# Patient Record
Sex: Female | Born: 1990 | Race: White | Hispanic: No | Marital: Single | Smoking: Never smoker
Health system: Southern US, Community
[De-identification: ages and names within clinical notes are randomized; demographics above are authoritative.]

## PROBLEM LIST (undated history)

## (undated) DIAGNOSIS — Z87898 Personal history of other specified conditions: Secondary | ICD-10-CM

## (undated) DIAGNOSIS — J45909 Unspecified asthma, uncomplicated: Secondary | ICD-10-CM

---

## 2012-08-21 ENCOUNTER — Emergency Department (HOSPITAL_COMMUNITY)
Admission: EM | Admit: 2012-08-21 | Discharge: 2012-08-21 | Disposition: A | Discharge status: Home / Self Care | Payer: PRIVATE HEALTH INSURANCE | Attending: Emergency Medicine | Admitting: Emergency Medicine

## 2012-08-21 ENCOUNTER — Encounter (HOSPITAL_COMMUNITY): Payer: Self-pay | Admitting: *Deleted

## 2012-08-21 ENCOUNTER — Emergency Department (HOSPITAL_COMMUNITY): Payer: PRIVATE HEALTH INSURANCE

## 2012-08-21 DIAGNOSIS — R55 Syncope and collapse: Secondary | ICD-10-CM | POA: Insufficient documentation

## 2012-08-21 DIAGNOSIS — Z79899 Other long term (current) drug therapy: Secondary | ICD-10-CM | POA: Insufficient documentation

## 2012-08-21 DIAGNOSIS — R42 Dizziness and giddiness: Secondary | ICD-10-CM | POA: Insufficient documentation

## 2012-08-21 DIAGNOSIS — E162 Hypoglycemia, unspecified: Secondary | ICD-10-CM | POA: Insufficient documentation

## 2012-08-21 DIAGNOSIS — M25539 Pain in unspecified wrist: Secondary | ICD-10-CM | POA: Insufficient documentation

## 2012-08-21 DIAGNOSIS — Z3202 Encounter for pregnancy test, result negative: Secondary | ICD-10-CM | POA: Insufficient documentation

## 2012-08-21 HISTORY — DX: Personal history of other specified conditions: Z87.898

## 2012-08-21 HISTORY — DX: Unspecified asthma, uncomplicated: J45.909

## 2012-08-21 LAB — URINE MICROSCOPIC-ADD ON

## 2012-08-21 LAB — URINALYSIS, ROUTINE W REFLEX MICROSCOPIC
Bilirubin Urine: NEGATIVE
Nitrite: NEGATIVE
Specific Gravity, Urine: 1.009 (ref 1.005–1.030)
Urobilinogen, UA: 0.2 mg/dL (ref 0.0–1.0)
pH: 8 (ref 5.0–8.0)

## 2012-08-21 LAB — BASIC METABOLIC PANEL
CO2: 26 mEq/L (ref 19–32)
Chloride: 99 mEq/L (ref 96–112)
Glucose, Bld: 91 mg/dL (ref 70–99)
Potassium: 4 mEq/L (ref 3.5–5.1)
Sodium: 135 mEq/L (ref 135–145)

## 2012-08-21 LAB — CBC
Hemoglobin: 14.4 g/dL (ref 12.0–15.0)
Platelets: 310 10*3/uL (ref 150–400)
RBC: 4.32 MIL/uL (ref 3.87–5.11)
WBC: 5.8 10*3/uL (ref 4.0–10.5)

## 2012-08-21 LAB — GLUCOSE, CAPILLARY: Glucose-Capillary: 70 mg/dL (ref 70–99)

## 2012-08-21 LAB — POCT PREGNANCY, URINE: Preg Test, Ur: NEGATIVE

## 2012-08-21 MED ORDER — SODIUM CHLORIDE 0.9 % IV BOLUS (SEPSIS)
1000.0000 mL | Freq: Once | INTRAVENOUS | Status: AC
  Administered 2012-08-21: 1000 mL via INTRAVENOUS

## 2012-08-21 NOTE — ED Notes (Addendum)
Pt in from [employer] [facility] [facility] student center, reports having a syncopal episode while standing talking to her friend. Reports has had similar episodes in the past. Pt has small lac to bottom of chin, bleeding controlled. Pt also c/o left hand/wrist pain from fall.

## 2012-08-21 NOTE — ED Notes (Signed)
Albert, [place] notified of current blood sugar.

## 2012-08-21 NOTE — ED Provider Notes (Signed)
History     CSN: [id number]  Arrival date & time 08/21/12  1156   First MD Initiated Contact with Patient 08/21/12 1201      Chief Complaint  Patient presents with  . Loss of Consciousness    (Consider location/radiation/quality/duration/timing/severity/associated sxs/prior treatment) HPI Comments: 22 year old female with no significant past medical history presents the emergency department with her roommate from [employer] [facility] student center after having a syncopal episode about an hour prior to arrival. Patient states she was standing talking to her friend when she began feeling lightheaded with decreased hearing and then "passed out". The rest of the history is obtained from her friend since patient does not recall what happened thereafter. Friend states patient was talking to her when she noticed that her is beginning to grow back, patient started to walk away so she followed her and patient fell the ground and began having "strange body movements for a few seconds". She did not hit her head. Patient was only on the ground for a few seconds when she then opened her eyes and stood up and felt lightheaded. Lightheadedness has since subsided. Currently patient denies any symptoms. States this has happened to her a few times in the past. She went to the health Center after school and was told her blood pressure was very low and to go to the emergency department. States she has a history of low blood pressure and this is normal for her. Denies associated nausea, vomiting, confusion, fever or chills. She ate oatmeal for breakfast and was feeling fine prior to symptom onset. Last night denies doing anything besides sitting in her dorm room and relaxing. Denies recent alcohol use. Denies any drug use. Last menstrual period ended yesterday and was normal. Also complaining of L wrist pain from falling onto her L wrist during her syncopal episode.  Patient is a 22 y.o. female presenting with syncope. The history is  provided by the patient and a friend.  Loss of Consciousness  Associated symptoms include light-headedness. Pertinent negatives include confusion, dizziness, nausea and vomiting.    Past Medical History  Diagnosis Date  . Allergy-induced asthma   . H/O syncope     History reviewed. No pertinent past surgical history.  History reviewed. No pertinent family history.  History  Substance Use Topics  . Smoking status: Never Smoker   . Smokeless tobacco: Not on file  . Alcohol Use: Yes     Comment: occ    OB History   Grav Para Term Preterm Abortions TAB SAB Ect Mult Living                  Review of Systems  HENT: Positive for hearing loss and tinnitus.   Cardiovascular: Positive for syncope.  Gastrointestinal: Negative for nausea and vomiting.  Musculoskeletal:       Positive for L wrist pain.  Neurological: Positive for syncope and light-headedness. Negative for dizziness.  Psychiatric/Behavioral: Negative for confusion.  All other systems reviewed and are negative.    Allergies  Shellfish allergy  Home Medications   Current Outpatient Rx  Name  Route  Sig  Dispense  Refill  . ALPRAZolam (XANAX) 0.25 MG tablet   Oral   Take 0.25 mg by mouth 3 (three) times daily as needed for sleep or anxiety.         Marland Kitchen atomoxetine (STRATTERA) 40 MG capsule   Oral   Take 40 mg by mouth every morning.         Marland Kitchen  desvenlafaxine (PRISTIQ) 50 MG 24 hr tablet   Oral   Take 50 mg by mouth every other day.         . fluticasone (FLONASE) 50 MCG/ACT nasal spray   Nasal   Place 2 sprays into the nose daily.         Marland Kitchen loratadine (CLARITIN) 10 MG tablet   Oral   Take 10 mg by mouth daily.           BP 106/85  Pulse 71  Temp(Src) 97.8 F (36.6 C) (Oral)  Resp 16  Ht 5' (1.524 m)  Wt 110 lb (49.896 kg)  BMI 21.48 kg/m2  SpO2 100%  LMP 08/17/2012  Physical Exam  Nursing note and vitals reviewed. Constitutional: She is oriented to person, place, and time. She  appears well-developed and well-nourished. No distress.  HENT:  Head: Normocephalic and atraumatic.  Mouth/Throat: Oropharynx is clear and moist.  Eyes: Conjunctivae and EOM are normal. Pupils are equal, round, and reactive to light.  Neck: Normal range of motion. Neck supple.  Cardiovascular: Normal rate, regular rhythm, normal heart sounds and intact distal pulses.   Pulmonary/Chest: Effort normal and breath sounds normal. No respiratory distress.  Abdominal: Soft. Bowel sounds are normal. There is no tenderness.  Musculoskeletal: Normal range of motion. She exhibits no edema.       Left wrist: She exhibits tenderness and bony tenderness (generalized over carpal bones). She exhibits normal range of motion, no swelling and no deformity.       Left forearm: Normal.       Left hand: Normal. She exhibits normal capillary refill.  Neurological: She is alert and oriented to person, place, and time. She has normal strength. No cranial nerve deficit or sensory deficit. She displays a negative Romberg sign. She displays no seizure activity. Coordination and gait normal.  Skin: Skin is warm and dry. No pallor.  Psychiatric: She has a normal mood and affect. Her behavior is normal.    ED Course  Procedures (including critical care time)  Labs Reviewed  BASIC METABOLIC PANEL - Abnormal; Notable for the following:    BUN <3 (*)    All other components within normal limits  URINALYSIS, ROUTINE W REFLEX MICROSCOPIC - Abnormal; Notable for the following:    Hgb urine dipstick MODERATE (*)    Leukocytes, UA TRACE (*)    All other components within normal limits  URINE MICROSCOPIC-ADD ON - Abnormal; Notable for the following:    Squamous Epithelial / LPF FEW (*)    All other components within normal limits  CBC  GLUCOSE, CAPILLARY  POCT PREGNANCY, URINE   No results found.  Date: 08/21/2012  Rate: 64  Rhythm: normal sinus rhythm  QRS Axis: normal  Intervals: PR shortened borderline  ST/T  Wave abnormalities: normal  Conduction Disutrbances:none  Narrative Interpretation: borderline short PR interval, otherwise normal EKG  Old EKG Reviewed: none available    1. Syncope   2. Hypoglycemia       MDM  22 y/o female with syncopal episode. Asymptomatic in ED. CBG 70 upon arrival. After eating some of [place] sandwich CBG 79. Glucose on BMP 91. She is not orthostatic. Symptoms most likely from hypoglycemia. EKG normal. She may have had a possible seizure. She has no PCP. Resource guide given for PCP follow up. Close return precautions discussed. Patient states understanding of plan and is agreeable.         Trevor Mace, PA-C 08/21/12 1531

## 2012-08-21 NOTE — ED Notes (Signed)
Patient is resting comfortably. Pt talking with visitor at bedside

## 2012-08-21 NOTE — ED Notes (Signed)
Albert, PA at bedside.  

## 2012-08-22 LAB — GLUCOSE, CAPILLARY: Glucose-Capillary: 79 mg/dL (ref 70–99)

## 2012-08-25 NOTE — ED Provider Notes (Signed)
Medical screening examination/treatment/procedure(s) were performed by non-physician practitioner and as supervising physician I was immediately available for consultation/collaboration.   Suzi Roots, MD 08/25/12 2130

## 2012-12-17 LAB — HM PAP SMEAR: HM PAP: NORMAL

## 2013-05-24 ENCOUNTER — Telehealth: Payer: Self-pay

## 2013-05-24 NOTE — Telephone Encounter (Signed)
Medication and allergies:  Reviewed and updated  90 day supply/mail order: na [facility] [address]   Immunizations due: unsure of last Tdap vaccine   A/P:   FH, PSH, personal hx--states no additions or health risk in her family Pap--gyn  To Discuss with Provider: Not at this time

## 2013-05-25 ENCOUNTER — Encounter: Payer: Self-pay | Admitting: Family Medicine

## 2013-05-25 ENCOUNTER — Ambulatory Visit (INDEPENDENT_AMBULATORY_CARE_PROVIDER_SITE_OTHER): Payer: BC Managed Care – HMO | Admitting: Family Medicine

## 2013-05-25 VITALS — BP 110/80 | HR 100 | Temp 98.3°F | Resp 16 | Ht 60.5 in | Wt 113.5 lb

## 2013-05-25 DIAGNOSIS — Z309 Encounter for contraceptive management, unspecified: Secondary | ICD-10-CM

## 2013-05-25 DIAGNOSIS — F341 Dysthymic disorder: Secondary | ICD-10-CM

## 2013-05-25 DIAGNOSIS — IMO0001 Reserved for inherently not codable concepts without codable children: Secondary | ICD-10-CM

## 2013-05-25 DIAGNOSIS — F418 Other specified anxiety disorders: Secondary | ICD-10-CM

## 2013-05-25 DIAGNOSIS — J309 Allergic rhinitis, unspecified: Secondary | ICD-10-CM | POA: Insufficient documentation

## 2013-05-25 DIAGNOSIS — G43109 Migraine with aura, not intractable, without status migrainosus: Secondary | ICD-10-CM

## 2013-05-25 MED ORDER — MECLIZINE HCL 50 MG PO TABS: 50.0000 mg | ORAL_TABLET | Freq: Three times a day (TID) | ORAL | Status: DC | PRN

## 2013-05-25 MED ORDER — LEVONORGESTREL-ETHINYL ESTRAD 0.1-20 MG-MCG PO TABS: 1.0000 | ORAL_TABLET | Freq: Every day | ORAL | Status: DC

## 2013-05-25 NOTE — Patient Instructions (Signed)
Schedule your complete physical for this summer when you're available Your birth control is at the pharmacy Call and schedule an appt w/ psych (they also have counseling) Continue the Nasonex and Claritin daily Call with any questions or concerns Welcome!  We're glad to have you! Happy New Year!!!

## 2013-05-25 NOTE — Progress Notes (Signed)
Pre visit review using our clinic review tool, if applicable. No additional management support is needed unless otherwise documented below in the visit note. 

## 2013-05-25 NOTE — Progress Notes (Signed)
   Subjective:    Patient ID: Madison GuilesBridget Bush, female    DOB: 06-15-1990, 23 y.o.   MRN: [id number]  HPI New to establish.  Previous MD- none recently   GYN- [facility] (last seen 7/14)  Allergies- chronic problem, on Claritin and Nasacort.  Allergy sxs are well controlled.  Allergies are 'much worse in [place]'.  Had skin testing- allergic to cats, mold, trees, grass, dust mites.  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  Depression/Anxiety- chronic problem, currently on Pristiq, xanax, Strattera.  Has been seeing psych since age of 23.  Feels sxs are well controlled.  No thoughts of harming herself.  Eating and sleeping well.  Anxiety is worse at night, will take xanax prn.  Birth Control- pt is having trouble w/ script converting from [place].  UTD on pap.  Doing well on meds.   Review of Systems For ROS see HPI     Objective:   Physical Exam  Vitals reviewed. Constitutional: She is oriented to person, place, and time. She appears well-developed and well-nourished. No distress.  HENT:  Head: Normocephalic and atraumatic.  Eyes: Conjunctivae and EOM are normal. Pupils are equal, round, and reactive to light.  Neck: Normal range of motion. Neck supple. No thyromegaly present.  Cardiovascular: Normal rate, regular rhythm, normal heart sounds and intact distal pulses.   No murmur heard. Pulmonary/Chest: Effort normal and breath sounds normal. No respiratory distress.  Abdominal: Soft. She exhibits no distension. There is no tenderness.  Musculoskeletal: She exhibits no edema.  Lymphadenopathy:    She has no cervical adenopathy.  Neurological: She is alert and oriented to person, place, and time.  Skin: Skin is warm and dry.  Psychiatric: She has a normal mood and affect. Her behavior is normal.          Assessment & Plan:

## 2013-05-25 NOTE — Addendum Note (Signed)
Addended by: Sheliah HatchABORI, Akashdeep Chuba E on: 05/25/2013 04:57 PM   Modules accepted: Orders

## 2013-05-25 NOTE — Assessment & Plan Note (Signed)
New to provider, ongoing for pt.  Asking for psych and therapist referral.  Names and #s provided.  Pt to schedule.

## 2013-05-25 NOTE — Assessment & Plan Note (Signed)
New.  Pt is having difficulty w/ her prescription transferring from [place] to [place].  Will refill med for pt as she is UTD on pap.  Pt appreciative

## 2013-05-25 NOTE — Assessment & Plan Note (Signed)
New to provider, ongoing for pt.  sxs well controlled on nasal steroid and antihistamine.  Reviewed supportive care and red flags that should prompt return.  Pt expressed understanding and is in agreement w/ plan.

## 2013-05-25 NOTE — Assessment & Plan Note (Signed)
New.  Triggered by allergies.  Start Meclizine prn.

## 2013-07-20 ENCOUNTER — Encounter: Payer: Self-pay | Admitting: Nurse Practitioner

## 2013-07-20 ENCOUNTER — Ambulatory Visit (INDEPENDENT_AMBULATORY_CARE_PROVIDER_SITE_OTHER): Payer: BC Managed Care – HMO | Admitting: Nurse Practitioner

## 2013-07-20 VITALS — BP 96/64 | HR 73 | Temp 99.1°F | Ht 60.5 in | Wt 112.0 lb

## 2013-07-20 DIAGNOSIS — L988 Other specified disorders of the skin and subcutaneous tissue: Secondary | ICD-10-CM

## 2013-07-20 DIAGNOSIS — R238 Other skin changes: Secondary | ICD-10-CM

## 2013-07-20 NOTE — Progress Notes (Signed)
Subjective:     Madison Bush is a 23 y.o. female and is here c/o R ear pain just inside the canal for 3d.Marland Kitchen.    History   Social History  . Marital Status: Single    Spouse Name: N/A    Number of Children: N/A  . Years of Education: N/A   Occupational History  . Not on file.   Social History Main Topics  . Smoking status: Never Smoker   . Smokeless tobacco: Not on file  . Alcohol Use: Yes     Comment: occ  . Drug Use: No  . Sexual Activity: Yes   Other Topics Concern  . Not on file   Social History Narrative  . No narrative on file   Health Maintenance  Topic Date Due  . Influenza Vaccine  12/17/2013  . Pap Smear  12/18/2015  . Tetanus/tdap  05/25/2022    The following portions of the patient's history were reviewed and updated as appropriate: allergies, current medications, past medical history, past social history, past surgical history and problem list.  Review of Systems Constitutional: negative Ears, nose, mouth, throat, and face: negative except for R ear canal pain Respiratory: negative   Objective:    BP 96/64  Pulse 73  Temp(Src) 99.1 F (37.3 C) (Oral)  Ht 5' 0.5" (1.537 m)  Wt 112 lb (50.803 kg)  BMI 21.51 kg/m2  SpO2 99% General appearance: alert, cooperative, appears stated age and no distress Head: Normocephalic, without obvious abnormality, atraumatic Eyes: negative findings: lids and lashes normal and conjunctivae and sclerae normal Ears: normal TM and external ear canal left ear and nml TM R ear, small pimple inside R canal, no head. Nose: Nares normal. Septum midline. Mucosa normal. No drainage or sinus tenderness. Throat: lips, mucosa, and tongue normal; teeth and gums normal Lungs: clear to auscultation bilaterally Heart: regular rate and rhythm, S1, S2 normal, no murmur, click, rub or gallop    Assessment:    Pimple R ear canal     Plan:    Apply neosporin or benzyl peroxide daily. See After Visit Summary for Counseling  Recommendations

## 2013-07-20 NOTE — Progress Notes (Signed)
Pre visit review using our clinic review tool, if applicable. No additional management support is needed unless otherwise documented below in the visit note. 

## 2013-07-20 NOTE — Patient Instructions (Signed)
Apply neosprin and or benzyl peroxide to pimple in ear daily (1 in am, 1 at pm). It will take about 1 week for it to clear.

## 2013-08-03 ENCOUNTER — Ambulatory Visit (INDEPENDENT_AMBULATORY_CARE_PROVIDER_SITE_OTHER): Payer: BC Managed Care – HMO | Admitting: Nurse Practitioner

## 2013-08-03 ENCOUNTER — Encounter: Payer: Self-pay | Admitting: Nurse Practitioner

## 2013-08-03 VITALS — BP 98/67 | HR 93 | Temp 98.3°F | Ht 60.5 in | Wt 110.0 lb

## 2013-08-03 DIAGNOSIS — J069 Acute upper respiratory infection, unspecified: Secondary | ICD-10-CM

## 2013-08-03 NOTE — Progress Notes (Signed)
   Subjective:    Patient ID: Madison GuilesBridget Rickerson, female    DOB: Nov 01, 1990, 23 y.o.   MRN: [id number]  Sinusitis This is a new problem. The current episode started 1 to 4 weeks ago (1 wk). The problem is unchanged. The maximum temperature recorded prior to her arrival was 100 - 100.9 F (resolved). The fever has been present for less than 1 day. The pain is moderate. Associated symptoms include congestion, ear pain, headaches and sinus pressure. Pertinent negatives include no chills, coughing, hoarse voice, shortness of breath, sore throat or swollen glands. Treatments tried: netty pot & dayquil. The treatment provided no relief.      Review of Systems  Constitutional: Positive for fever and fatigue. Negative for chills.  HENT: Positive for congestion, ear pain and sinus pressure. Negative for hoarse voice and sore throat.   Respiratory: Negative for cough and shortness of breath.   Gastrointestinal: Negative for nausea, abdominal pain and diarrhea.  Neurological: Positive for headaches.       Objective:   Physical Exam  Vitals reviewed. Constitutional: She is oriented to person, place, and time. She appears well-developed and well-nourished. No distress.  HENT:  Head: Normocephalic and atraumatic.  Right Ear: External ear normal.  Left Ear: External ear normal.  Mouth/Throat: Oropharyngeal exudate present.  Clear fluid bilaterally, bones visible.  Eyes: Conjunctivae are normal. Right eye exhibits no discharge. Left eye exhibits no discharge.  Neck: Normal range of motion. Neck supple. No thyromegaly present.  Cardiovascular: Normal rate, regular rhythm and normal heart sounds.   No murmur heard. Pulmonary/Chest: Effort normal and breath sounds normal. No respiratory distress. She has no wheezes. She has no rales.  Lymphadenopathy:    She has no cervical adenopathy.  Neurological: She is alert and oriented to person, place, and time.  Skin: Skin is warm and dry.  Psychiatric: She  has a normal mood and affect. Her behavior is normal. Thought content normal.          Assessment & Plan:  1. Viral upper respiratory illness Duration 1 wk. Nasal congestion, sinus pressure. Supportive care. See pt instructions.

## 2013-08-03 NOTE — Patient Instructions (Signed)
You have a cold virus causing your symptoms. The average duration of cold symptoms is 14 days. Start daily sinus rinses (Neilmed Sinus Rinse-bottle not netty pot). Use 30 mg to 60 mg pseudoephedrine twice daily. Sip fluids every hour. Rest. If you are not feeling better in 1 week or develop fever or chest pain, call us for re-evaluation. Feel better!  Upper Respiratory Infection, Adult An upper respiratory infection (URI) is also sometimes known as the common cold. The upper respiratory tract includes the nose, sinuses, throat, trachea, and bronchi. Bronchi are the airways leading to the lungs. Most people improve within 1 week, but symptoms can last up to 2 weeks. A residual cough may last even longer.  CAUSES Many different viruses can infect the tissues lining the upper respiratory tract. The tissues become irritated and inflamed and often become very moist. Mucus production is also common. A cold is contagious. You can easily spread the virus to others by oral contact. This includes kissing, sharing a glass, coughing, or sneezing. Touching your mouth or nose and then touching a surface, which is then touched by another person, can also spread the virus. SYMPTOMS  Symptoms typically develop 1 to 3 days after you come in contact with a cold virus. Symptoms vary from person to person. They may include:  Runny nose.  Sneezing.  Nasal congestion.  Sinus irritation.  Sore throat.  Loss of voice (laryngitis).  Cough.  Fatigue.  Muscle aches.  Loss of appetite.  Headache.  Low-grade fever. DIAGNOSIS  You might diagnose your own cold based on familiar symptoms, since most people get a cold 2 to 3 times a year. Your caregiver can confirm this based on your exam. Most importantly, your caregiver can check that your symptoms are not due to another disease such as strep throat, sinusitis, pneumonia, asthma, or epiglottitis. Blood tests, throat tests, and X-rays are not necessary to diagnose a  common cold, but they may sometimes be helpful in excluding other more serious diseases. Your caregiver will decide if any further tests are required. RISKS AND COMPLICATIONS  You may be at risk for a more severe case of the common cold if you smoke cigarettes, have chronic heart disease (such as heart failure) or lung disease (such as asthma), or if you have a weakened immune system. The very young and very old are also at risk for more serious infections. Bacterial sinusitis, middle ear infections, and bacterial pneumonia can complicate the common cold. The common cold can worsen asthma and chronic obstructive pulmonary disease (COPD). Sometimes, these complications can require emergency medical care and may be life-threatening. PREVENTION  The best way to protect against getting a cold is to practice good hygiene. Avoid oral or hand contact with people with cold symptoms. Wash your hands often if contact occurs. There is no clear evidence that vitamin C, vitamin E, echinacea, or exercise reduces the chance of developing a cold. However, it is always recommended to get plenty of rest and practice good nutrition. TREATMENT  Treatment is directed at relieving symptoms. There is no cure. Antibiotics are not effective, because the infection is caused by a virus, not by bacteria. Treatment may include:  Increased fluid intake. Sports drinks offer valuable electrolytes, sugars, and fluids.  Breathing heated mist or steam (vaporizer or shower).  Eating chicken soup or other clear broths, and maintaining good nutrition.  Getting plenty of rest.  Using gargles or lozenges for comfort.  Controlling fevers with ibuprofen or acetaminophen as directed by  your caregiver.  Increasing usage of your inhaler if you have asthma. Zinc gel and zinc lozenges, taken in the first 24 hours of the common cold, can shorten the duration and lessen the severity of symptoms. Pain medicines may help with fever, muscle  aches, and throat pain. A variety of non-prescription medicines are available to treat congestion and runny nose. Your caregiver can make recommendations and may suggest nasal or lung inhalers for other symptoms.  HOME CARE INSTRUCTIONS   Only take over-the-counter or prescription medicines for pain, discomfort, or fever as directed by your caregiver.  Use a warm mist humidifier or inhale steam from a shower to increase air moisture. This may keep secretions moist and make it easier to breathe.  Drink enough water and fluids to keep your urine clear or pale yellow.  Rest as needed.  Return to work when your temperature has returned to normal or as your caregiver advises. You may need to stay home longer to avoid infecting others. You can also use a face mask and careful hand washing to prevent spread of the virus. SEEK MEDICAL CARE IF:   After the first few days, you feel you are getting worse rather than better.  You need your caregiver's advice about medicines to control symptoms.  You develop chills, worsening shortness of breath, or brown or red sputum. These may be signs of pneumonia.  You develop yellow or brown nasal discharge or pain in the face, especially when you bend forward. These may be signs of sinusitis.  You develop a fever, swollen neck glands, pain with swallowing, or white areas in the back of your throat. These may be signs of strep throat. SEEK IMMEDIATE MEDICAL CARE IF:   You have a fever.  You develop severe or persistent headache, ear pain, sinus pain, or chest pain.  You develop wheezing, a prolonged cough, cough up blood, or have a change in your usual mucus (if you have chronic lung disease).  You develop sore muscles or a stiff neck. Document Released: 10/29/2000 Document Revised: 07/28/2011 Document Reviewed: 09/06/2010 [facility] Patient Information 2014 [employer] [place].

## 2013-08-03 NOTE — Progress Notes (Signed)
Pre visit review using our clinic review tool, if applicable. No additional management support is needed unless otherwise documented below in the visit note. 

## 2013-11-29 ENCOUNTER — Telehealth: Payer: Self-pay | Admitting: *Deleted

## 2013-11-29 NOTE — Telephone Encounter (Signed)
Called pt back and gave her the names for [facility] and [facility] again.

## 2013-11-29 NOTE — Telephone Encounter (Signed)
Caller name:  [name] Relation to pt:  self Call back number: [phone]  Pharmacy:  Reason for call:   Pt called, states you had given her a list of psychiatrist you recommend at her appt 05/25/2013.  She has misplaced the list and would like to see if you could give her a few names.

## 2014-02-07 ENCOUNTER — Other Ambulatory Visit: Payer: Self-pay

## 2014-02-07 NOTE — Telephone Encounter (Signed)
Madison Bush [phone]  Doria called and wanted to see if Dr Beverely Low could prescribe ALPRAZolam [name]) 0.25 MG tablet, atomoxetine (STRATTERA) 40 MG capsule and desvenlafaxine (PRISTIQ) 50 MG 24 hr tablet, these medicines so that she could see a therapist instead of a physiatrists

## 2014-02-08 NOTE — Telephone Encounter (Signed)
Called pt to verify what pharmacy she prefers.

## 2014-02-08 NOTE — Telephone Encounter (Signed)
Ok to provide meds but pt needs to schedule CPE as directed in AVS from Jan

## 2014-02-08 NOTE — Telephone Encounter (Signed)
Please advise if this is ok.  

## 2014-02-09 NOTE — Telephone Encounter (Signed)
Madison Bush Self [phone] [facility]  Spoke with [facility] pharmacy is [facility] and I also schedule her an CPE for 06/08/14

## 2014-02-10 MED ORDER — ATOMOXETINE HCL 40 MG PO CAPS: 40.0000 mg | ORAL_CAPSULE | Freq: Every morning | ORAL | Status: DC

## 2014-02-10 MED ORDER — ALPRAZOLAM 0.25 MG PO TABS: 0.2500 mg | ORAL_TABLET | Freq: Three times a day (TID) | ORAL | Status: DC | PRN

## 2014-02-10 MED ORDER — DESVENLAFAXINE SUCCINATE ER 50 MG PO TB24: 50.0000 mg | ORAL_TABLET | ORAL | Status: DC

## 2014-02-10 NOTE — Telephone Encounter (Signed)
Noted meds filled and faxed.

## 2014-04-17 ENCOUNTER — Telehealth: Payer: Self-pay | Admitting: General Practice

## 2014-04-17 ENCOUNTER — Telehealth: Payer: Self-pay | Admitting: Family Medicine

## 2014-04-17 MED ORDER — ALPRAZOLAM 0.5 MG PO TBDP: 0.5000 mg | ORAL_TABLET | Freq: Two times a day (BID) | ORAL | Status: DC | PRN

## 2014-04-17 MED ORDER — DESVENLAFAXINE SUCCINATE ER 50 MG PO TB24: 50.0000 mg | ORAL_TABLET | Freq: Every day | ORAL | Status: DC

## 2014-04-17 NOTE — Telephone Encounter (Signed)
Last OV 05-25-13 Alprazolam last filled 02-10-14 #30 with 3

## 2014-04-17 NOTE — Addendum Note (Signed)
Addended by: Jackson LatinoYLER, Luwana Butrick L on: 04/17/2014 03:40 PM   Modules accepted: Orders

## 2014-04-17 NOTE — Telephone Encounter (Signed)
She is taking 1 tablet every day and she is out as her rx called for 1 every other day.  [facility] [address]

## 2014-04-17 NOTE — Telephone Encounter (Signed)
Med filled with new sig.  

## 2014-04-17 NOTE — Telephone Encounter (Signed)
Medication filled.  

## 2014-04-17 NOTE — Telephone Encounter (Signed)
Ok for #30, 1 refill.  Please ask pt how many she is taking (on average) per month

## 2014-04-17 NOTE — Telephone Encounter (Signed)
Called and lmovm for pt to return call.  

## 2014-04-17 NOTE — Telephone Encounter (Signed)
Pt states she is willing to increase to 1.5 mg. Pt mostly take medication at night for sleep but does not want to limit herself in case she needs it during the day.

## 2014-04-17 NOTE — Telephone Encounter (Signed)
Pt reported during call w/ CMA that she was taking 3 tabs at same time as needed (0.25mg  dose for total of 0.75mg ).  Will switch pt to 0.5mg  tabs so she can take 1.5 tabs as needed.  Ok for 0.5mg  BID PRN, #60, 1 refill

## 2014-04-18 ENCOUNTER — Telehealth: Payer: Self-pay | Admitting: Family Medicine

## 2014-04-18 MED ORDER — ALPRAZOLAM 0.5 MG PO TABS: 0.5000 mg | ORAL_TABLET | Freq: Two times a day (BID) | ORAL | Status: DC | PRN

## 2014-04-18 NOTE — Telephone Encounter (Signed)
Med changed and rx faxed this am.

## 2014-04-18 NOTE — Telephone Encounter (Signed)
[facility]as[facility]an Murphy  Relation to pt: pharmacist  Call back number:  [phone]   Reason for call:  As per pharmacist pt does not want the dissolvable ALPRAZolam (NIRAVAM) 0.5 MG to expensive rather the tablet and pharmaicst stated please do not leave a message.

## 2014-04-24 ENCOUNTER — Telehealth: Payer: Self-pay | Admitting: Family Medicine

## 2014-04-24 NOTE — Telephone Encounter (Signed)
Caller name:Mccarley, Bridger Relation to UX:LKGMpt:self Call back number:[phone] [facility]  Reason for call: [facility] for a [facility] for rx atomoxetine (STRATTERA) 40 MG capsule, pt is needing pa for the rx and she only has 1 day left on her meds

## 2014-04-25 NOTE — Telephone Encounter (Signed)
Prior authorization for Straterra approved effective 04/04/2014 through 04/25/2015

## 2014-04-25 NOTE — Telephone Encounter (Signed)
Called and informed patient. JG//CMA

## 2014-05-11 ENCOUNTER — Other Ambulatory Visit: Payer: Self-pay | Admitting: General Practice

## 2014-05-11 MED ORDER — LEVONORGESTREL-ETHINYL ESTRAD 0.1-20 MG-MCG PO TABS
1.0000 | ORAL_TABLET | Freq: Every day | ORAL | Status: DC
Start: 2014-05-11 — End: 2014-09-05

## 2014-06-08 ENCOUNTER — Encounter: Payer: Self-pay | Admitting: Family Medicine

## 2014-06-08 ENCOUNTER — Ambulatory Visit (INDEPENDENT_AMBULATORY_CARE_PROVIDER_SITE_OTHER): Payer: BLUE CROSS/BLUE SHIELD | Admitting: Family Medicine

## 2014-06-08 VITALS — BP 102/78 | HR 78 | Temp 98.2°F | Resp 16 | Ht 61.0 in | Wt 114.4 lb

## 2014-06-08 DIAGNOSIS — Z0000 Encounter for general adult medical examination without abnormal findings: Secondary | ICD-10-CM

## 2014-06-08 LAB — HEPATIC FUNCTION PANEL
ALT: 17 U/L (ref 0–35)
AST: 24 U/L (ref 0–37)
Albumin: 5 g/dL (ref 3.5–5.2)
Alkaline Phosphatase: 45 U/L (ref 39–117)
BILIRUBIN INDIRECT: 0.4 mg/dL (ref 0.2–1.2)
Bilirubin, Direct: 0.1 mg/dL (ref 0.0–0.3)
TOTAL PROTEIN: 7.6 g/dL (ref 6.0–8.3)
Total Bilirubin: 0.5 mg/dL (ref 0.2–1.2)

## 2014-06-08 LAB — BASIC METABOLIC PANEL
BUN: 3 mg/dL — ABNORMAL LOW (ref 6–23)
CO2: 25 mEq/L (ref 19–32)
CREATININE: 0.73 mg/dL (ref 0.50–1.10)
Calcium: 9.9 mg/dL (ref 8.4–10.5)
Chloride: 102 mEq/L (ref 96–112)
GLUCOSE: 78 mg/dL (ref 70–99)
Potassium: 3.8 mEq/L (ref 3.5–5.3)
Sodium: 139 mEq/L (ref 135–145)

## 2014-06-08 LAB — LIPID PANEL
CHOLESTEROL: 155 mg/dL (ref 0–200)
HDL: 83 mg/dL (ref >39)
LDL CALC: 59 mg/dL (ref 0–99)
Total CHOL/HDL Ratio: 1.9 Ratio
Triglycerides: 64 mg/dL (ref <150)
VLDL: 13 mg/dL (ref 0–40)

## 2014-06-08 LAB — CBC WITH DIFFERENTIAL/PLATELET
Basophils Absolute: 0 10*3/uL (ref 0.0–0.1)
Basophils Relative: 0 % (ref 0–1)
EOS ABS: 0.1 10*3/uL (ref 0.0–0.7)
Eosinophils Relative: 1 % (ref 0–5)
HCT: 38.9 % (ref 36.0–46.0)
Hemoglobin: 13.4 g/dL (ref 12.0–15.0)
LYMPHS ABS: 1.5 10*3/uL (ref 0.7–4.0)
Lymphocytes Relative: 18 % (ref 12–46)
MCH: 32.7 pg (ref 26.0–34.0)
MCHC: 34.4 g/dL (ref 30.0–36.0)
MCV: 94.9 fL (ref 78.0–100.0)
MPV: 9.5 fL (ref 8.6–12.4)
Monocytes Absolute: 0.5 10*3/uL (ref 0.1–1.0)
Monocytes Relative: 6 % (ref 3–12)
Neutro Abs: 6.3 10*3/uL (ref 1.7–7.7)
Neutrophils Relative %: 75 % (ref 43–77)
Platelets: 374 10*3/uL (ref 150–400)
RBC: 4.1 MIL/uL (ref 3.87–5.11)
RDW: 12.9 % (ref 11.5–15.5)
WBC: 8.4 10*3/uL (ref 4.0–10.5)

## 2014-06-08 LAB — TSH: TSH: 0.439 u[IU]/mL (ref 0.350–4.500)

## 2014-06-08 NOTE — Patient Instructions (Signed)
Follow up in 1 year or as needed We'll notify you of your lab results and make any changes if needed Keep up the good work!  You look great!! Call with any questions or concerns Stay Safe and Warm this Weekend!!

## 2014-06-08 NOTE — Progress Notes (Signed)
Pre visit review using our clinic review tool, if applicable. No additional management support is needed unless otherwise documented below in the visit note. 

## 2014-06-08 NOTE — Progress Notes (Signed)
   Subjective:    Patient ID: Madison Bush, female    DOB: 1991-01-15, 24 y.o.   MRN: [id number]  HPI CPE- due for pap next year (last done 12/2012).   Review of Systems Patient reports no vision/ hearing changes, adenopathy,fever, weight change,  persistant/recurrent hoarseness , swallowing issues, chest pain, palpitations, edema, persistant/recurrent cough, hemoptysis, dyspnea (rest/exertional/paroxysmal nocturnal), gastrointestinal bleeding (melena, rectal bleeding), abdominal pain, significant heartburn, bowel changes, GU symptoms (dysuria, hematuria, incontinence), Gyn symptoms (abnormal  bleeding, pain),  syncope, focal weakness, memory loss, numbness & tingling, skin/hair/nail changes, abnormal bruising or bleeding, anxiety, or depression.     Objective:   Physical Exam General Appearance:    Alert, cooperative, no distress, appears stated age  Head:    Normocephalic, without obvious abnormality, atraumatic  Eyes:    PERRL, conjunctiva/corneas clear, EOM's intact, fundi    benign, both eyes  Ears:    Normal TM's and external ear canals, both ears  Nose:   Nares normal, septum midline, mucosa normal, no drainage    or sinus tenderness  Throat:   Lips, mucosa, and tongue normal; teeth and gums normal  Neck:   Supple, symmetrical, trachea midline, no adenopathy;    Thyroid: no enlargement/tenderness/nodules  Back:     Symmetric, no curvature, ROM normal, no CVA tenderness  Lungs:     Clear to auscultation bilaterally, respirations unlabored  Chest Wall:    No tenderness or deformity   Heart:    Regular rate and rhythm, S1 and S2 normal, no murmur, rub   or gallop  Breast Exam:    Deferred to GYN  Abdomen:     Soft, non-tender, bowel sounds active all four quadrants,    no masses, no organomegaly  Genitalia:    Deferred to GYN  Rectal:    Extremities:   Extremities normal, atraumatic, no cyanosis or edema  Pulses:   2+ and symmetric all extremities  Skin:   Skin color, texture,  turgor normal, no rashes or lesions  Lymph nodes:   Cervical, supraclavicular, and axillary nodes normal  Neurologic:   CNII-XII intact, normal strength, sensation and reflexes    throughout          Assessment & Plan:

## 2014-06-09 LAB — VITAMIN D 25 HYDROXY (VIT D DEFICIENCY, FRACTURES): Vit D, 25-Hydroxy: 50 ng/mL (ref 30–100)

## 2014-06-10 NOTE — Assessment & Plan Note (Signed)
Pt's PE WNL.  UTD on GYN.  Check labs.  Anticipatory guidance provided.  

## 2014-06-12 ENCOUNTER — Encounter: Payer: Self-pay | Admitting: General Practice

## 2014-06-13 ENCOUNTER — Other Ambulatory Visit: Payer: Self-pay | Admitting: Family Medicine

## 2014-06-13 NOTE — Telephone Encounter (Signed)
Caller name: Charelle Relation to pt: self Call back number: [phone] [facility] [address] [address]  Reason for call:   Patient requesting a refill of all medications

## 2014-06-14 MED ORDER — ALPRAZOLAM 0.5 MG PO TABS: 0.5000 mg | ORAL_TABLET | Freq: Two times a day (BID) | ORAL | Status: DC | PRN

## 2014-06-14 MED ORDER — ATOMOXETINE HCL 40 MG PO CAPS: 40.0000 mg | ORAL_CAPSULE | Freq: Every morning | ORAL | Status: DC

## 2014-06-14 MED ORDER — MECLIZINE HCL 50 MG PO TABS: 50.0000 mg | ORAL_TABLET | Freq: Three times a day (TID) | ORAL | Status: DC | PRN

## 2014-06-14 NOTE — Telephone Encounter (Signed)
Meds filled

## 2014-06-14 NOTE — Telephone Encounter (Signed)
Last OV 06-08-14 Xanax last filled 12-1 #60 with 1 strattera last filled 9-25 #30 with 3

## 2014-07-13 ENCOUNTER — Telehealth: Payer: Self-pay | Admitting: Family Medicine

## 2014-07-13 NOTE — Telephone Encounter (Signed)
Caller name:Stasha Nancarrow Relationship to patient:self Can be reached:[phone]   Reason for call: PT requesting lamictal 25 mg per psychiatrist [facility]- says will be 4 weeks before she can get into the psychiatrist- and in her previous visit was suggested she be put on this medicine.

## 2014-07-13 NOTE — Telephone Encounter (Signed)
Last OV: 06/08/14  Will patient need an appointment?  Please advise.

## 2014-07-14 NOTE — Telephone Encounter (Signed)
PT returning Ashlee's call- please call back on [phone] after 3pm

## 2014-07-14 NOTE — Telephone Encounter (Signed)
If pt was told by Psych (who is also an MD) to start this medication, they need to be the one prescribing it.  This is not a medication I prescribe.

## 2014-07-14 NOTE — Telephone Encounter (Signed)
Spoke with patient and made her aware.  She's okay with having to wait for her psychiatrist.

## 2014-07-14 NOTE — Telephone Encounter (Signed)
Left a message for call back.  

## 2014-07-23 ENCOUNTER — Other Ambulatory Visit: Payer: Self-pay | Admitting: Family Medicine

## 2014-07-24 NOTE — Telephone Encounter (Signed)
Last OV 06-08-14 alprazolam last filled 06-14-14 #60 with 1

## 2014-07-24 NOTE — Telephone Encounter (Signed)
Med filled and faxed.  

## 2014-08-10 ENCOUNTER — Other Ambulatory Visit: Payer: Self-pay | Admitting: Family Medicine

## 2014-08-10 NOTE — Telephone Encounter (Signed)
Med filled.  

## 2014-08-25 ENCOUNTER — Encounter: Payer: Self-pay | Admitting: Family Medicine

## 2014-08-25 ENCOUNTER — Other Ambulatory Visit (HOSPITAL_COMMUNITY)
Admission: RE | Admit: 2014-08-25 | Discharge: 2014-08-25 | Disposition: A | Discharge status: Home / Self Care | Payer: BC Managed Care – PPO | Source: Ambulatory Visit | Attending: Family Medicine | Admitting: Family Medicine

## 2014-08-25 ENCOUNTER — Ambulatory Visit (INDEPENDENT_AMBULATORY_CARE_PROVIDER_SITE_OTHER): Payer: BC Managed Care – PPO | Admitting: Family Medicine

## 2014-08-25 VITALS — BP 120/82 | HR 100 | Temp 98.1°F | Resp 16 | Wt 116.4 lb

## 2014-08-25 DIAGNOSIS — N390 Urinary tract infection, site not specified: Secondary | ICD-10-CM | POA: Diagnosis not present

## 2014-08-25 DIAGNOSIS — N760 Acute vaginitis: Secondary | ICD-10-CM | POA: Diagnosis not present

## 2014-08-25 DIAGNOSIS — N3000 Acute cystitis without hematuria: Secondary | ICD-10-CM | POA: Diagnosis not present

## 2014-08-25 DIAGNOSIS — R350 Frequency of micturition: Secondary | ICD-10-CM | POA: Diagnosis not present

## 2014-08-25 DIAGNOSIS — R82998 Other abnormal findings in urine: Secondary | ICD-10-CM

## 2014-08-25 LAB — POCT URINALYSIS DIPSTICK
Bilirubin, UA: NEGATIVE
Glucose, UA: NEGATIVE
Ketones, UA: NEGATIVE
Nitrite, UA: NEGATIVE
PH UA: 7.5
PROTEIN UA: NEGATIVE
RBC UA: NEGATIVE
SPEC GRAV UA: 1.01
Urobilinogen, UA: 0.2

## 2014-08-25 MED ORDER — CEPHALEXIN 500 MG PO CAPS: 500.0000 mg | ORAL_CAPSULE | Freq: Two times a day (BID) | ORAL | Status: AC

## 2014-08-25 NOTE — Assessment & Plan Note (Signed)
New.  Pt's sxs, hx, and UA consistent w/ infxn.  Start abx.  Await culture results.

## 2014-08-25 NOTE — Progress Notes (Signed)
Pre visit review using our clinic review tool, if applicable. No additional management support is needed unless otherwise documented below in the visit note. 

## 2014-08-25 NOTE — Assessment & Plan Note (Signed)
New.  Pt reports vaginal pain/burning.  No change in vaginal d/c.  Wet prep collected.  Will treat if needed once results available

## 2014-08-25 NOTE — Patient Instructions (Signed)
Follow up as needed Start the Keflex twice daily for UTI Drink plenty of fluids Call with any questions or concerns Have a great weekend!

## 2014-08-25 NOTE — Progress Notes (Signed)
   Subjective:    Patient ID: Madison GuilesBridget Bush, female    DOB: Jul 24, 1990, 24 y.o.   MRN: [id number]  HPI UTI- sxs started 9 days ago but 'it kinda went away for awhile'.  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  Pt has seen urology previously and was given empiric abx to take after intercourse.  + suprapubic pressure, denies dysuria.  Denies itching, burning, excessive d/c.  + vaginal pain.     Review of Systems For ROS see HPI     Objective:   Physical Exam  Constitutional: She is oriented to person, place, and time. She appears well-developed and well-nourished. No distress.  HENT:  Head: Normocephalic and atraumatic.  Abdominal: Soft. She exhibits no distension. There is tenderness (+ suprapubic but no CVA tenderness ). There is no rebound and no guarding.  Genitourinary: Vagina normal. No vaginal discharge found.  Wet prep collected  Neurological: She is alert and oriented to person, place, and time.  Skin: Skin is warm and dry. No rash noted. No erythema.  Psychiatric: She has a normal mood and affect. Her behavior is normal. Thought content normal.  Vitals reviewed.         Assessment & Plan:

## 2014-08-27 LAB — URINE CULTURE

## 2014-08-28 LAB — CERVICOVAGINAL ANCILLARY ONLY: WET PREP (BD AFFIRM): NEGATIVE

## 2014-08-29 ENCOUNTER — Telehealth: Payer: Self-pay | Admitting: Family Medicine

## 2014-08-29 NOTE — Telephone Encounter (Signed)
Wants urine results

## 2014-08-29 NOTE — Telephone Encounter (Signed)
Called and left a detailed message for pt informing of the results.

## 2014-09-05 ENCOUNTER — Other Ambulatory Visit: Payer: Self-pay | Admitting: Family Medicine

## 2014-09-05 NOTE — Telephone Encounter (Signed)
Med filled.  

## 2014-09-29 ENCOUNTER — Other Ambulatory Visit: Payer: Self-pay | Admitting: Family Medicine

## 2014-09-29 MED ORDER — ALPRAZOLAM 0.5 MG PO TABS: ORAL_TABLET | ORAL | Status: DC

## 2014-09-29 NOTE — Telephone Encounter (Signed)
Last OV 08-25-14 Alprazolam last filled 07-24-14 #60 with 1

## 2014-09-29 NOTE — Telephone Encounter (Signed)
Med filled and faxed.  

## 2014-09-29 NOTE — Telephone Encounter (Signed)
Caller name:Elsy Kepple Relationship to patient:self Can be [phone] [facility] [address]  Reason for call: PT requesting RX for ALPRAZolam (XANAX) 0.5 MG tablet [[id number]][[id number]] be refilled.

## 2014-10-21 ENCOUNTER — Other Ambulatory Visit: Payer: Self-pay | Admitting: Family Medicine

## 2014-10-23 NOTE — Telephone Encounter (Signed)
Med filled.  

## 2014-10-24 IMAGING — CR DG WRIST COMPLETE 3+V*L*
4 series · 4 of 4 positions shown · non-contrast
Comparison: None

CLINICAL DATA: Status post fall

LEFT WRIST - COMPLETE 3+ VIEW

[x wrist pa left]
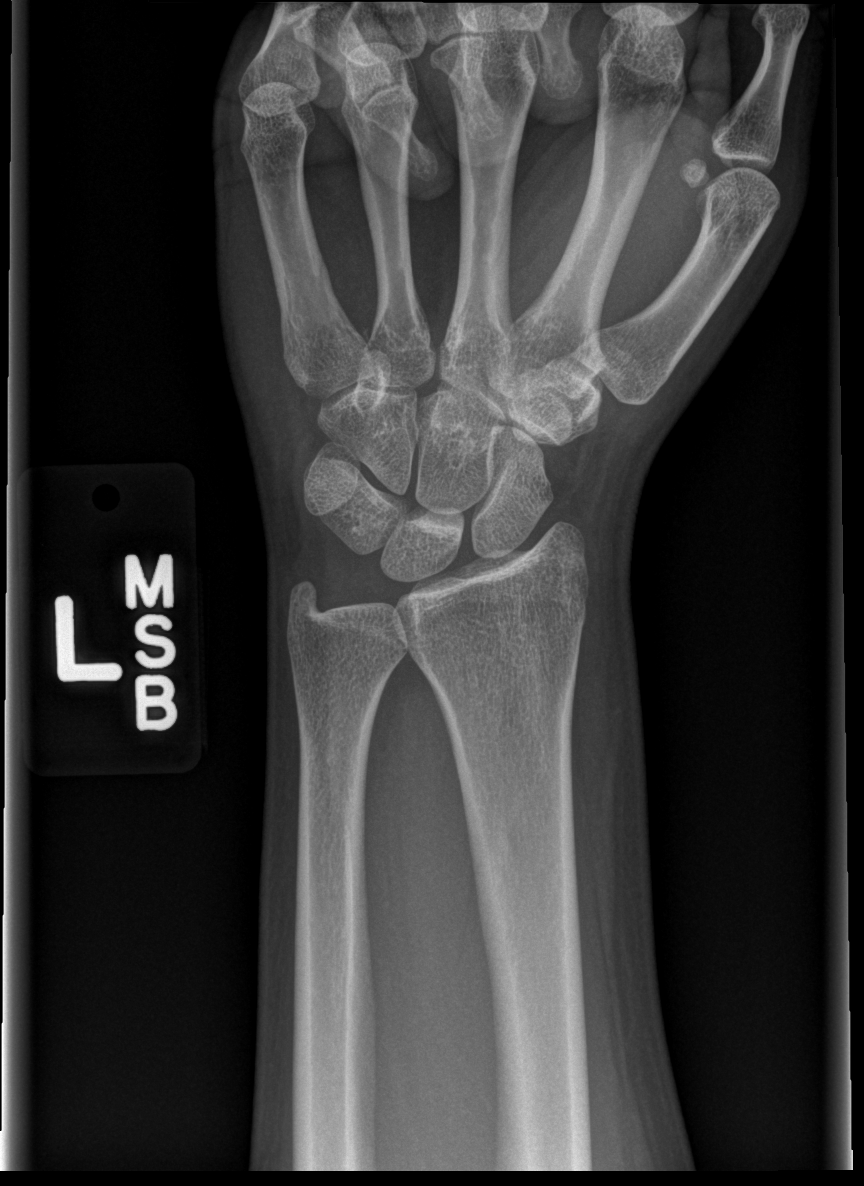

[x wrist obl left]
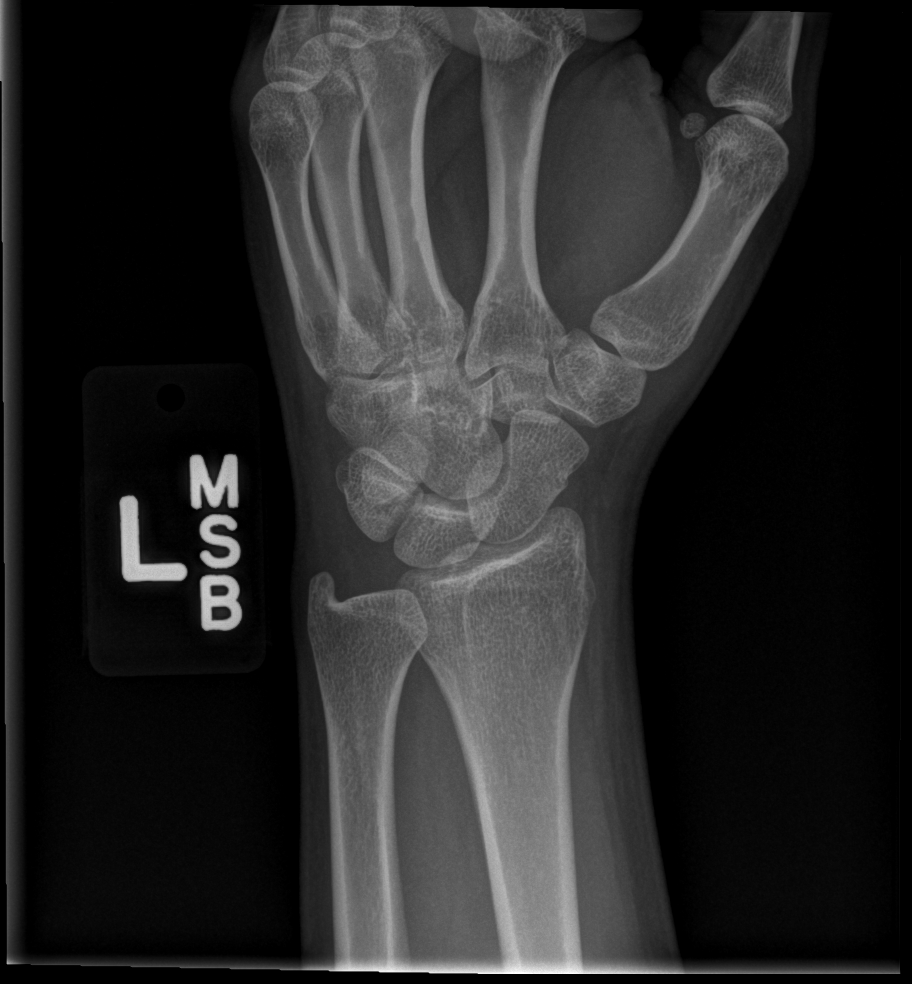

[x wrist lat left]
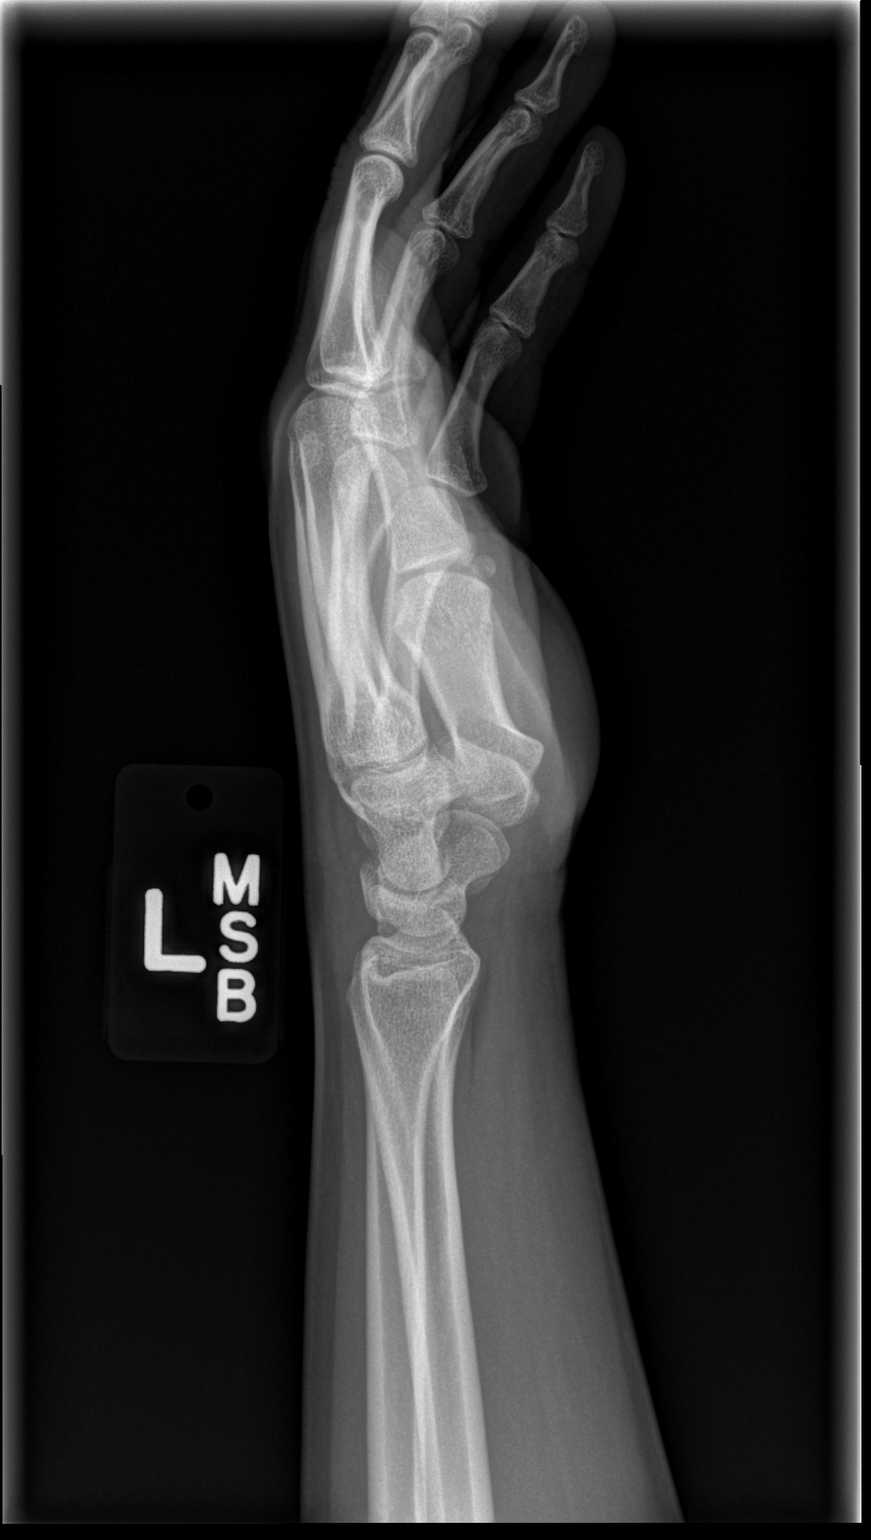

[x wrist navicular view left]
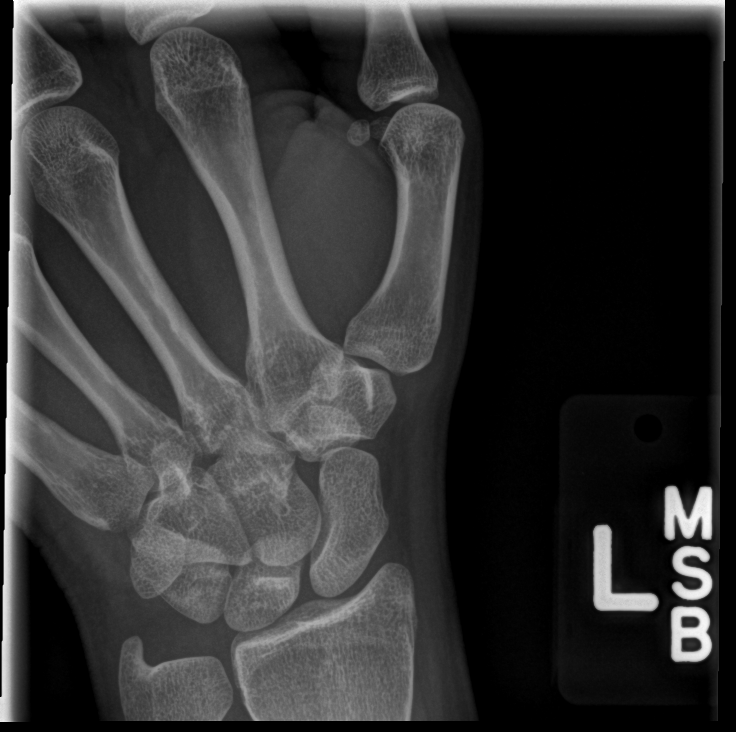

[4 of 4 positions shown; findings below may reference images not displayed]

FINDINGS: There is no evidence of fracture or dislocation.  There
is no evidence of arthropathy or other focal bone abnormality.
Soft tissues are unremarkable.
IMPRESSION: Negative exam.

## 2014-10-26 ENCOUNTER — Other Ambulatory Visit: Payer: Self-pay | Admitting: Family Medicine

## 2014-10-26 NOTE — Telephone Encounter (Signed)
Med filled and faxed.  

## 2014-10-26 NOTE — Telephone Encounter (Signed)
Last OV 08-25-14 Alprazolam last filled 09-29-14 #60 with 1  Pharmacy stated that she has no refills

## 2014-11-02 ENCOUNTER — Telehealth: Payer: Self-pay | Admitting: Family Medicine

## 2014-11-02 MED ORDER — ALPRAZOLAM 0.5 MG PO TABS: 0.5000 mg | ORAL_TABLET | Freq: Two times a day (BID) | ORAL | Status: DC | PRN

## 2014-11-02 NOTE — Telephone Encounter (Signed)
Refill #15 only.   

## 2014-11-02 NOTE — Telephone Encounter (Signed)
Pharmacy in [place] would only accept 5-day supply (10 pills).  Rx reprinted, verbal order given to pharmacist, and per pharmacist sent hard copy to pharmacy at address provided.

## 2014-11-02 NOTE — Telephone Encounter (Signed)
Caller name: Jessalee  Relation to pt: self Call back number: [phone] Pharmacy:  Reason for call:   Patient states that she is visiting family in [place] and has run out of xanax. Is [facility] [address] [place], [place] [address] F: [phone]

## 2014-11-02 NOTE — Telephone Encounter (Signed)
Rx printed

## 2014-11-02 NOTE — Telephone Encounter (Signed)
Patient requesting Xanax refill be sent to pharmacy in [place] per below note.   Last refill was sent 10/26/14, but per both pharmacies patient did not pick up that refill.  [facility], last picked up 09/29/14 for 60 with 0.   Last office visit 08/25/14 for UTI and CPE was 06/08/14.  No UDS.   Please advise if OK to send to [facility] pharmacy.

## 2014-11-08 ENCOUNTER — Telehealth: Payer: Self-pay | Admitting: Family Medicine

## 2014-11-08 MED ORDER — ALPRAZOLAM 0.5 MG PO TABS: 0.5000 mg | ORAL_TABLET | Freq: Two times a day (BID) | ORAL | Status: DC | PRN

## 2014-11-08 NOTE — Telephone Encounter (Signed)
Spoke with pt local pharmacy. They advised that pt did not pick up the refill from 09/29/14 and did not pick up the refill on 10/26/14. These medications were cancelled at her local pharmacy. Ok to send in a new prescription to [place]? Per last phone note they will only accept 10 tablets at a time.

## 2014-11-08 NOTE — Telephone Encounter (Signed)
Relation to pt: self  Call back number: [phone]  [facility] [address], [place], [place] [address] [phone]    Reason for call:  Pt requesting a refill ALPRAZolam (XANAX) 0.5 MG tablet

## 2014-11-08 NOTE — Telephone Encounter (Signed)
Last OV 08-25-14 (UTI) Alprazolam last filled 11/02/14 #10 with 0 (Dr. Laury Axon filled)   Ok to send to [place]?

## 2014-11-08 NOTE — Telephone Encounter (Signed)
[name] from [facility] in [place], [place] states that this cannot be sent in by fax but can be escribed. If called in then it has to be a 5 day supply. [name] state that escribe will be easier. [phone]

## 2014-11-08 NOTE — Telephone Encounter (Signed)
We can attempt to send 60 but if [place] will only accept 10 pills then that's what we'll do.

## 2014-11-08 NOTE — Telephone Encounter (Signed)
Pt notified that rx was faxed.

## 2014-11-08 NOTE — Telephone Encounter (Signed)
Medication filled and faxed #60 with 0

## 2014-11-08 NOTE — Telephone Encounter (Signed)
Pt got 70 alprazolam this month- will not provide refills at this time

## 2014-11-09 MED ORDER — ALPRAZOLAM 0.5 MG PO TABS: 0.5000 mg | ORAL_TABLET | Freq: Two times a day (BID) | ORAL | Status: DC | PRN

## 2014-11-09 NOTE — Telephone Encounter (Signed)
Spoke with pharmacy who states per [employer] that they cannot accept a voicemail control substance Rx. They need the MD to call the order to the pharmacist. Will be the only way it can be accepted. Voided the voice mail script. The MD may call it in at their convenience and they will be glad to accept. [phone]

## 2014-11-09 NOTE — Telephone Encounter (Signed)
Please advise? I have no option to escribe a controlled substance. Should I just cal in a 5 day rx?

## 2014-11-09 NOTE — Telephone Encounter (Signed)
Med filled and rx mailed.

## 2014-11-09 NOTE — Telephone Encounter (Signed)
Ok to call in 10 pills.

## 2014-11-09 NOTE — Telephone Encounter (Signed)
Med phoned in °

## 2014-11-09 NOTE — Telephone Encounter (Signed)
Tried calling rx into pharmacy, no answer. Will call again later.

## 2014-11-09 NOTE — Telephone Encounter (Signed)
Called pharmacy, gave verbal order for #10, no refills.  Pharmacy states they need hard copy mailed to them.

## 2014-11-09 NOTE — Addendum Note (Signed)
Addended by: Jackson Latino on: 11/09/2014 11:55 AM   Modules accepted: Orders

## 2014-11-17 ENCOUNTER — Other Ambulatory Visit: Payer: Self-pay | Admitting: Family Medicine

## 2014-11-17 MED ORDER — ALPRAZOLAM 0.5 MG PO TABS: 0.5000 mg | ORAL_TABLET | Freq: Two times a day (BID) | ORAL | Status: AC | PRN

## 2014-11-17 NOTE — Telephone Encounter (Signed)
Caller name: Donia GuilesBridget Dunkleberger Relationship to patient: self Can be reached: [phone] [id number] [place] - [address]  Reason for call: Pt calling in for refill on Xanax. Explained previous notes to her and review med history. Per med list 10/26/14 #60 2/day [facility] [address], 11/02/14 #10 2/day sent [place] [facility] [place], 11/09/14 #10 2/day sent to [facility] [place]. Pt states that she never picked up the RX for #60 2/day [facility] [address]. She says that she is out of medication and is in [place] still. She comes home on 11/23/14. She is requesting another refill sent to [place] and knows only #10 can be dispensed. She asked that we [facility] [address] because she knows she did not pick it up and is only taking 2/day. Please notify pt if med will be filled or not.

## 2014-11-17 NOTE — Telephone Encounter (Signed)
Noted will try to call in rx to pharmacy. Hard copy printed and mailed.

## 2014-11-17 NOTE — Telephone Encounter (Signed)
Please advise, i know that this was a hassle to fill the last time with the [place] pharmacy.  Not sure if they will let us send this in again

## 2014-11-17 NOTE — Telephone Encounter (Signed)
Med called in, hard script mailed. Tried to inform pt, phone rang as busy.

## 2014-11-17 NOTE — Telephone Encounter (Signed)
Ok for #10 but next time, pt needs to pick up her meds PRIOR to going out of state b/c this was a very big hassle trying to get controlled substances to [place] for her.  If she had picked up meds, she would have had the month supply to use.

## 2014-12-08 ENCOUNTER — Ambulatory Visit (INDEPENDENT_AMBULATORY_CARE_PROVIDER_SITE_OTHER): Payer: BC Managed Care – PPO | Admitting: Family Medicine

## 2014-12-08 VITALS — BP 106/74 | HR 106 | Temp 98.6°F | Resp 20 | Ht 60.0 in | Wt 112.0 lb

## 2014-12-08 DIAGNOSIS — L989 Disorder of the skin and subcutaneous tissue, unspecified: Secondary | ICD-10-CM | POA: Diagnosis not present

## 2014-12-08 DIAGNOSIS — R238 Other skin changes: Secondary | ICD-10-CM

## 2014-12-08 NOTE — Patient Instructions (Signed)
Follow up as needed No need to apply any products new or different Try and avoid scratching Hats will prevent direct sunlight and drying of the scalp No evidence of ring worm at this time Call with any questions or concerns Have a great trip to the [place] [employer]!

## 2014-12-08 NOTE — Progress Notes (Signed)
Pre visit review using our clinic review tool, if applicable. No additional management support is needed unless otherwise documented below in the visit note. 

## 2014-12-08 NOTE — Progress Notes (Signed)
   Subjective:    Patient ID: Madison Bush, female    DOB: [birth year], 24 y.o.   MRN: [id number]  HPI Scalp lesion- pt has hx of ringworm at end of school year on upper arm.  [place] and developed severe itching on crown of head.  Pt admits to scratching head and then felt some crusting.  This caused her to worry about possible tinea of scalp.   Review of Systems For ROS see HPI     Objective:   Physical Exam  Constitutional: She appears well-developed and well-nourished. No distress.  HENT:  Head: Normocephalic and atraumatic.  Skin: Skin is warm and dry.  Scattered excoriations on crown of head w/o obvious fungal element.  Some hyperpigmentation w/o indication of ringworm.  Vitals reviewed.         Assessment & Plan:

## 2014-12-10 NOTE — Assessment & Plan Note (Signed)
No evidence of ringworm.  Scalp has some hyperpigmentation and excoriations but no patchy hair loss or breakage.  Encouraged her to avoid scratching or picking.  No need to change hair products.  Pt to f/u as needed.  Pt expressed understanding and is in agreement w/ plan.

## 2015-03-26 ENCOUNTER — Telehealth: Payer: Self-pay | Admitting: Family Medicine

## 2015-03-26 NOTE — Telephone Encounter (Signed)
Patient complaining of sore throat x 1 week. Out of school tomorrow and request to see Dr. Beverely Lowabori. Plse advise

## 2015-03-27 NOTE — Telephone Encounter (Signed)
Ok to use 11am, schedule for full 30 minutes

## 2015-03-27 NOTE — Telephone Encounter (Signed)
Called patient and she states that she is feeling better this morning. Does not need to come in. DLS

## 2015-03-27 NOTE — Telephone Encounter (Signed)
Noted  

## 2015-04-24 ENCOUNTER — Ambulatory Visit: Payer: Self-pay | Admitting: Family Medicine

## 2015-06-09 ENCOUNTER — Other Ambulatory Visit: Payer: Self-pay | Admitting: Family Medicine

## 2015-06-11 NOTE — Telephone Encounter (Signed)
Medication filled to pharmacy as requested.   

## 2015-08-08 ENCOUNTER — Other Ambulatory Visit: Payer: Self-pay | Admitting: Family Medicine

## 2015-08-09 NOTE — Telephone Encounter (Signed)
Medication filled to pharmacy as requested.   

## 2015-09-09 ENCOUNTER — Encounter: Payer: Self-pay | Admitting: Family Medicine

## 2015-09-09 ENCOUNTER — Other Ambulatory Visit: Payer: Self-pay | Admitting: Family Medicine

## 2015-09-10 MED ORDER — LEVONORGESTREL-ETHINYL ESTRAD 0.1-20 MG-MCG PO TABS: 1.0000 | ORAL_TABLET | Freq: Every day | ORAL | Status: DC

## 2015-10-08 ENCOUNTER — Other Ambulatory Visit: Payer: Self-pay | Admitting: General Practice

## 2015-10-08 MED ORDER — LEVONORGESTREL-ETHINYL ESTRAD 0.1-20 MG-MCG PO TABS: 1.0000 | ORAL_TABLET | Freq: Every day | ORAL | Status: DC

## 2015-10-19 ENCOUNTER — Encounter: Payer: BC Managed Care – PPO | Admitting: Family Medicine

## 2015-10-24 ENCOUNTER — Ambulatory Visit (INDEPENDENT_AMBULATORY_CARE_PROVIDER_SITE_OTHER): Payer: BC Managed Care – PPO | Admitting: Family Medicine

## 2015-10-24 ENCOUNTER — Encounter: Payer: Self-pay | Admitting: Family Medicine

## 2015-10-24 ENCOUNTER — Other Ambulatory Visit (HOSPITAL_COMMUNITY)
Admission: RE | Admit: 2015-10-24 | Discharge: 2015-10-24 | Disposition: A | Discharge status: Home / Self Care | Payer: BC Managed Care – PPO | Source: Ambulatory Visit | Attending: Family Medicine | Admitting: Family Medicine

## 2015-10-24 VITALS — BP 102/78 | HR 73 | Temp 98.0°F | Resp 16 | Ht 60.0 in | Wt 113.4 lb

## 2015-10-24 DIAGNOSIS — Z0000 Encounter for general adult medical examination without abnormal findings: Secondary | ICD-10-CM | POA: Diagnosis not present

## 2015-10-24 DIAGNOSIS — Z01419 Encounter for gynecological examination (general) (routine) without abnormal findings: Secondary | ICD-10-CM | POA: Diagnosis present

## 2015-10-24 DIAGNOSIS — Z1151 Encounter for screening for human papillomavirus (HPV): Secondary | ICD-10-CM | POA: Diagnosis present

## 2015-10-24 DIAGNOSIS — Z124 Encounter for screening for malignant neoplasm of cervix: Secondary | ICD-10-CM | POA: Diagnosis not present

## 2015-10-24 MED ORDER — LEVONORGESTREL-ETHINYL ESTRAD 0.1-20 MG-MCG PO TABS: 1.0000 | ORAL_TABLET | Freq: Every day | ORAL | Status: DC

## 2015-10-24 NOTE — Assessment & Plan Note (Signed)
Pap collected. 

## 2015-10-24 NOTE — Progress Notes (Signed)
   Subjective:    Patient ID: Madison GuilesBridget Bush, female    DOB: 1990-11-01, 25 y.o.   MRN: [id number]  HPI CPE- due for pap.  No concerns today.  Moving to [place] this Summer.   Review of Systems Patient reports no vision/ hearing changes, adenopathy,fever, weight change,  persistant/recurrent hoarseness , swallowing issues, chest pain, palpitations, edema, persistant/recurrent cough, hemoptysis, dyspnea (rest/exertional/paroxysmal nocturnal), gastrointestinal bleeding (melena, rectal bleeding), abdominal pain, significant heartburn, bowel changes, GU symptoms (dysuria, hematuria, incontinence), Gyn symptoms (abnormal  bleeding, pain),  syncope, focal weakness, memory loss, numbness & tingling, skin/hair/nail changes, abnormal bruising or bleeding, anxiety, or depression.     Objective:   Physical Exam   General Appearance:    Alert, cooperative, no distress, appears stated age  Head:    Normocephalic, without obvious abnormality, atraumatic  Eyes:    PERRL, conjunctiva/corneas clear, EOM's intact, fundi    benign, both eyes  Ears:    Normal TM's and external ear canals, both ears  Nose:   Nares normal, septum midline, mucosa normal, no drainage    or sinus tenderness  Throat:   Lips, mucosa, and tongue normal; teeth and gums normal  Neck:   Supple, symmetrical, trachea midline, no adenopathy;    Thyroid: no enlargement/tenderness/nodules  Back:     Symmetric, no curvature, ROM normal, no CVA tenderness  Lungs:     Clear to auscultation bilaterally, respirations unlabored  Chest Wall:    No tenderness or deformity   Heart:    Regular rate and rhythm, S1 and S2 normal, no murmur, rub   or gallop  Breast Exam:    No tenderness, masses, or nipple abnormality  Abdomen:     Soft, non-tender, bowel sounds active all four quadrants,    no masses, no organomegaly  Genitalia:    External genitalia normal, cervix normal in appearance, no CMT, uterus in normal size and position, adnexa w/out  mass or tenderness, mucosa pink and moist, no lesions or discharge present  Rectal:    Normal external appearance  Extremities:   Extremities normal, atraumatic, no cyanosis or edema  Pulses:   2+ and symmetric all extremities  Skin:   Skin color, texture, turgor normal, no rashes or lesions  Lymph nodes:   Cervical, supraclavicular, and axillary nodes normal  Neurologic:   CNII-XII intact, normal strength, sensation and reflexes    throughout          Assessment & Plan:

## 2015-10-24 NOTE — Assessment & Plan Note (Signed)
Pt's PE WNL.  Reviewed labs done last year- all WNL.  No need to repeat.  Pap done.  Anticipatory guidance provided.

## 2015-10-24 NOTE — Patient Instructions (Signed)
Follow up in 1 year or as needed Keep up the good work on healthy diet and regular exercise- you look great! Call with any questions or concerns I'm not sure if she's still practicing, but Dr Rosebud PolesMakeecha Bush is listed in the [place] area and she's great! GOOD LUCK WITH THE MOVE!!!

## 2015-10-24 NOTE — Progress Notes (Signed)
Pre visit review using our clinic review tool, if applicable. No additional management support is needed unless otherwise documented below in the visit note. 

## 2015-10-26 LAB — CYTOLOGY - PAP

## 2015-11-07 ENCOUNTER — Telehealth: Payer: Self-pay | Admitting: Family Medicine

## 2015-11-07 NOTE — Telephone Encounter (Signed)
Ok for tb skin test 

## 2015-11-07 NOTE — Telephone Encounter (Signed)
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 Please advise ok to shedule

## 2015-11-07 NOTE — Telephone Encounter (Signed)
Ok as long as it is not on a Thursday

## 2015-11-09 NOTE — Telephone Encounter (Signed)
Pt has been scheduled for Monday.

## 2015-11-12 ENCOUNTER — Ambulatory Visit (INDEPENDENT_AMBULATORY_CARE_PROVIDER_SITE_OTHER): Payer: BC Managed Care – PPO | Admitting: General Practice

## 2015-11-12 DIAGNOSIS — Z111 Encounter for screening for respiratory tuberculosis: Secondary | ICD-10-CM

## 2015-11-15 ENCOUNTER — Encounter: Payer: Self-pay | Admitting: General Practice

## 2015-11-15 LAB — TB SKIN TEST
INDURATION: 0 mm
TB SKIN TEST: NEGATIVE

## 2016-03-12 ENCOUNTER — Other Ambulatory Visit: Payer: Self-pay | Admitting: Family Medicine

## 2017-10-06 ENCOUNTER — Encounter: Payer: Self-pay | Admitting: General Practice
